# Patient Record
Sex: Female | Born: 1960 | Race: White | Hispanic: No | Marital: Married | Smoking: Never smoker
Health system: Southern US, Community
[De-identification: ages and names within clinical notes are randomized; demographics above are authoritative.]

## PROBLEM LIST (undated history)

## (undated) DIAGNOSIS — C801 Malignant (primary) neoplasm, unspecified: Secondary | ICD-10-CM

## (undated) HISTORY — PX: BREAST SURGERY: SHX581

## (undated) HISTORY — PX: ABDOMINAL HYSTERECTOMY: SHX81

## (undated) HISTORY — PX: TONSILLECTOMY: SUR1361

## (undated) HISTORY — PX: KNEE SURGERY: SHX244

## (undated) HISTORY — PX: APPENDECTOMY: SHX54

## (undated) HISTORY — PX: CHOLECYSTECTOMY: SHX55

---

## 2002-11-12 ENCOUNTER — Ambulatory Visit (HOSPITAL_COMMUNITY): Admission: RE | Admit: 2002-11-12 | Discharge: 2002-11-12 | Payer: Self-pay | Admitting: Orthopedic Surgery

## 2005-12-31 ENCOUNTER — Emergency Department: Payer: Self-pay | Admitting: Emergency Medicine

## 2005-12-31 ENCOUNTER — Other Ambulatory Visit: Payer: Self-pay

## 2014-02-18 ENCOUNTER — Encounter (HOSPITAL_BASED_OUTPATIENT_CLINIC_OR_DEPARTMENT_OTHER): Payer: Self-pay | Admitting: Emergency Medicine

## 2014-02-18 ENCOUNTER — Emergency Department (HOSPITAL_BASED_OUTPATIENT_CLINIC_OR_DEPARTMENT_OTHER)
Admission: EM | Admit: 2014-02-18 | Discharge: 2014-02-18 | Disposition: A | Discharge status: Home / Self Care | Payer: Worker's Compensation | Attending: Emergency Medicine | Admitting: Emergency Medicine

## 2014-02-18 ENCOUNTER — Emergency Department (HOSPITAL_BASED_OUTPATIENT_CLINIC_OR_DEPARTMENT_OTHER): Payer: Worker's Compensation

## 2014-02-18 DIAGNOSIS — S6991XA Unspecified injury of right wrist, hand and finger(s), initial encounter: Secondary | ICD-10-CM

## 2014-02-18 DIAGNOSIS — Y990 Civilian activity done for income or pay: Secondary | ICD-10-CM | POA: Insufficient documentation

## 2014-02-18 DIAGNOSIS — X58XXXA Exposure to other specified factors, initial encounter: Secondary | ICD-10-CM | POA: Insufficient documentation

## 2014-02-18 DIAGNOSIS — Y9289 Other specified places as the place of occurrence of the external cause: Secondary | ICD-10-CM | POA: Diagnosis not present

## 2014-02-18 DIAGNOSIS — T1490XA Injury, unspecified, initial encounter: Secondary | ICD-10-CM

## 2014-02-18 DIAGNOSIS — Y9389 Activity, other specified: Secondary | ICD-10-CM | POA: Diagnosis not present

## 2014-02-18 HISTORY — DX: Malignant (primary) neoplasm, unspecified: C80.1

## 2014-02-18 NOTE — ED Notes (Signed)
Given ace wrap for home use

## 2014-02-18 NOTE — ED Notes (Signed)
Injury to her right hand while at work today.

## 2014-02-18 NOTE — ED Provider Notes (Signed)
Medical screening examination/treatment/procedure(s) were performed by non-physician practitioner and as supervising physician I was immediately available for consultation/collaboration.   EKG Interpretation None        Wandra Arthurs, MD 02/18/14 [phone]

## 2014-02-18 NOTE — Discharge Instructions (Signed)
Elastic Bandage and RICE °Elastic bandages come in different shapes and sizes. They perform different functions. Your caregiver will help you to decide what is best for your protection, recovery, or rehabilitation following an injury. The following are some general tips to help you use an elastic bandage. °· Use the bandage as directed by the maker of the bandage you are using. °· Do not wrap it too tight. This may cut off the circulation of the arm or leg below the bandage. °· If part of your body beyond the bandage becomes blue, numb, or swollen, it is too tight. Loosen the bandage as needed to prevent these problems. °· See your caregiver or trainer if the bandage seems to be making your problems worse rather than better. °Bandages may be a reminder to you that you have an injury. However, they provide very little support. The few pounds of support they provide are minor considering the pressure it takes to injure a joint or tear ligaments. Therefore, the joint will not be able to handle all of the wear and tear it could before the injury. °The routine care of many injuries includes Rest, Ice, Compression, and Elevation (RICE). °· Rest is required to allow your body to heal. Generally, routine activities can be resumed when comfortable. Injured tendons and bones take about 6 weeks to heal. °· Icing the injury helps keep the swelling down and reduces pain. Do not apply ice directly to the skin. Put ice in a plastic bag. Place a towel between the skin and the bag. This will prevent frostbite to the skin. Apply ice bags to the injured area for 15-20 minutes, every 2 hours while awake. Do this for the first 24 to 48 hours, then as directed by your caregiver. °· Compression helps keep swelling down, gives support, and helps with discomfort. If an elastic bandage has been applied today, it should be removed and reapplied every 3 to 4 hours. It should not be applied tightly, but firmly enough to keep swelling down.  Watch fingers or toes for swelling, bluish discoloration, coldness, numbness, or increased pain. If any of these problems occur, remove the bandage and reapply it more loosely. If these problems persist, contact your caregiver. °· Elevation helps reduce swelling and decreases pain. The injured area (arms, hands, legs, or feet) should be placed near to or above the heart (center of the chest) if able. °Persistent pain and inability to use the injured area for more than 2 to 3 days are warning signs. You should see a caregiver for a follow-up visit as soon as possible. Initially, a minor broken bone (hairline fracture) may not be seen on X-rays. It may take 7 to 10 days to finally show up. Continued pain and swelling show that further evaluation and/or X-rays are needed. Make a follow-up visit with your caregiver. A specialist in reading X-rays (radiologist) will read your X-rays again. °Finding out the results of your test °Not all test results are available during your visit. If your test results are not back during the visit, make an appointment with your caregiver to find out the results. Do not assume everything is normal if you have not heard from your caregiver or the medical facility. It is important for you to follow up on all of your test results. °Document Released: 10/06/2001 Document Revised: 07/09/2011 Document Reviewed: 08/18/2007 °[employer]® Patient Information ©2015 [employer]. This information is not intended to replace advice given to you by your health care provider. Make sure   you discuss any questions you have with your health care provider. ° °
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
## 2014-02-26 ENCOUNTER — Emergency Department (HOSPITAL_BASED_OUTPATIENT_CLINIC_OR_DEPARTMENT_OTHER): Payer: Worker's Compensation

## 2014-02-26 ENCOUNTER — Encounter (HOSPITAL_BASED_OUTPATIENT_CLINIC_OR_DEPARTMENT_OTHER): Payer: Self-pay | Admitting: Emergency Medicine

## 2014-02-26 ENCOUNTER — Emergency Department (HOSPITAL_BASED_OUTPATIENT_CLINIC_OR_DEPARTMENT_OTHER)
Admission: EM | Admit: 2014-02-26 | Discharge: 2014-02-26 | Disposition: A | Discharge status: Home / Self Care | Payer: Worker's Compensation | Attending: Emergency Medicine | Admitting: Emergency Medicine

## 2014-02-26 DIAGNOSIS — Z859 Personal history of malignant neoplasm, unspecified: Secondary | ICD-10-CM | POA: Insufficient documentation

## 2014-02-26 DIAGNOSIS — X58XXXA Exposure to other specified factors, initial encounter: Secondary | ICD-10-CM | POA: Diagnosis not present

## 2014-02-26 DIAGNOSIS — S60921A Unspecified superficial injury of right hand, initial encounter: Secondary | ICD-10-CM | POA: Diagnosis present

## 2014-02-26 DIAGNOSIS — T1490XA Injury, unspecified, initial encounter: Secondary | ICD-10-CM

## 2014-02-26 DIAGNOSIS — Y9289 Other specified places as the place of occurrence of the external cause: Secondary | ICD-10-CM | POA: Insufficient documentation

## 2014-02-26 DIAGNOSIS — S62309A Unspecified fracture of unspecified metacarpal bone, initial encounter for closed fracture: Secondary | ICD-10-CM

## 2014-02-26 DIAGNOSIS — S62356A Nondisplaced fracture of shaft of fifth metacarpal bone, right hand, initial encounter for closed fracture: Secondary | ICD-10-CM | POA: Insufficient documentation

## 2014-02-26 DIAGNOSIS — Y9354 Activity, bowling: Secondary | ICD-10-CM | POA: Insufficient documentation

## 2014-02-26 MED ORDER — ONDANSETRON 4 MG PO TBDP
4.0000 mg | ORAL_TABLET | Freq: Once | ORAL | Status: AC
  Administered 2014-02-26: 4 mg via ORAL
  Filled 2014-02-26: qty 1

## 2014-02-26 MED ORDER — IBUPROFEN 800 MG PO TABS
800.0000 mg | ORAL_TABLET | Freq: Once | ORAL | Status: AC
  Administered 2014-02-26: 800 mg via ORAL
  Filled 2014-02-26: qty 1

## 2014-02-26 MED ORDER — HYDROCODONE-ACETAMINOPHEN 5-325 MG PO TABS: 1.0000 | ORAL_TABLET | Freq: Once | ORAL | Status: DC

## 2014-02-26 MED ORDER — IBUPROFEN 800 MG PO TABS: 800.0000 mg | ORAL_TABLET | Freq: Three times a day (TID) | ORAL | Status: AC

## 2014-02-26 NOTE — Discharge Instructions (Signed)
Cryotherapy °Cryotherapy means treatment with cold. Ice or gel packs can be used to reduce both pain and swelling. Ice is the most helpful within the first 24 to 48 hours after an injury or flare-up from overusing a muscle or joint. Sprains, strains, spasms, burning pain, shooting pain, and aches can all be eased with ice. Ice can also be used when recovering from surgery. Ice is effective, has very few side effects, and is safe for most people to use. °PRECAUTIONS  °Ice is not a safe treatment option for people with: °· Raynaud phenomenon. This is a condition affecting small blood vessels in the extremities. Exposure to cold may cause your problems to return. °· Cold hypersensitivity. There are many forms of cold hypersensitivity, including: °¨ Cold urticaria. Red, itchy hives appear on the skin when the tissues begin to warm after being iced. °¨ Cold erythema. This is a red, itchy rash caused by exposure to cold. °¨ Cold hemoglobinuria. Red blood cells break down when the tissues begin to warm after being iced. The hemoglobin that carry oxygen are passed into the urine because they cannot combine with blood proteins fast enough. °· Numbness or altered sensitivity in the area being iced. °If you have any of the following conditions, do not use ice until you have discussed cryotherapy with your caregiver: °· Heart conditions, such as arrhythmia, angina, or chronic heart disease. °· High blood pressure. °· Healing wounds or open skin in the area being iced. °· Current infections. °· Rheumatoid arthritis. °· Poor circulation. °· Diabetes. °Ice slows the blood flow in the region it is applied. This is beneficial when trying to stop inflamed tissues from spreading irritating chemicals to surrounding tissues. However, if you expose your skin to cold temperatures for too long or without the proper protection, you can damage your skin or nerves. Watch for signs of skin damage due to cold. °HOME CARE INSTRUCTIONS °Follow  these tips to use ice and cold packs safely. °· Place a dry or damp towel between the ice and skin. A damp towel will cool the skin more quickly, so you may need to shorten the time that the ice is used. °· For a more rapid response, add gentle compression to the ice. °· Ice for no more than 10 to 20 minutes at a time. The bonier the area you are icing, the less time it will take to get the benefits of ice. °· Check your skin after 5 minutes to make sure there are no signs of a poor response to cold or skin damage. °· Rest 20 minutes or more between uses. °· Once your skin is numb, you can end your treatment. You can test numbness by very lightly touching your skin. The touch should be so light that you do not see the skin dimple from the pressure of your fingertip. When using ice, most people will feel these normal sensations in this order: cold, burning, aching, and numbness. °· Do not use ice on someone who cannot communicate their responses to pain, such as small children or people with dementia. °HOW TO MAKE AN ICE PACK °Ice packs are the most common way to use ice therapy. Other methods include ice massage, ice baths, and cryosprays. Muscle creams that cause a cold, tingly feeling do not offer the same benefits that ice offers and should not be used as a substitute unless recommended by your caregiver. °To make an ice pack, do one of the following: °· Place crushed ice or a   bag of frozen vegetables in a sealable plastic bag. Squeeze out the excess air. Place this bag inside another plastic bag. Slide the bag into a pillowcase or place a damp towel between your skin and the bag.  Mix 3 parts water with 1 part rubbing alcohol. Freeze the mixture in a sealable plastic bag. When you remove the mixture from the freezer, it will be slushy. Squeeze out the excess air. Place this bag inside another plastic bag. Slide the bag into a pillowcase or place a damp towel between your skin and the bag. SEEK MEDICAL CARE  IF:  You develop white spots on your skin. This may give the skin a blotchy (mottled) appearance.  Your skin turns blue or pale.  Your skin becomes waxy or hard.  Your swelling gets worse. MAKE SURE YOU:   Understand these instructions.  Will watch your condition.  Will get help right away if you are not doing well or get worse. Document Released: 12/11/2010 Document Revised: 08/31/2013 Document Reviewed: 12/11/2010 [facility] Patient Information 2015 [place], [place]. This information is not intended to replace advice given to you by your health care provider. Make sure you discuss any questions you have with your health care provider. Hand Fracture, Fifth Metacarpal The small metacarpal is the bone at the base of the little finger between the knuckle and the wrist. A fracture is a break in that bone. One of the fractures that is common to this bone is called a Boxer's Fracture. TREATMENT These fractures can be treated with:   Reduction (bones moved back into place), then pinned through the skin to maintain the position, and then casted for about 6 weeks or as your caregiver determines necessary.  ORIF (open reduction and internal fixation) - the fracture site is opened and the bone pieces are fixed into place with pins and then casted for approximately 6 weeks or as your caregiver determines necessary. Your caregiver will discuss the type of fracture you have and the treatment that should be best for that problem. If surgery is the treatment of choice, the following is information for you to know, and also let your caregiver know about prior to surgery.  LET YOUR CAREGIVER KNOW ABOUT:  Allergies.  Medications taken including herbs, eye drops, over the counter medications, and creams.  Use of steroids (by mouth or creams).  Previous problems with anesthetics or novocaine.  Possibility of pregnancy, if this applies.  History of blood clots (thrombophlebitis).  History of  bleeding or blood problems.  Previous surgery.  Other health problems. AFTER THE PROCEDURE After surgery, you will be taken to the recovery area where a nurse will watch and check your progress. Once you're awake, stable, and taking fluids well, barring other problems you'll be allowed to go home. Once home an ice pack applied to your operative site may help with discomfort and keep the swelling down. HOME CARE INSTRUCTIONS   Follow your caregiver's instructions as to activities, exercises, physical therapy, and driving a car.  Daily exercise is helpful for maintaining range of motion (movement and mobility) and strength. Exercise as instructed.  To lessen swelling, keep the injured hand elevated above the level of your heart as much as possible.  Apply ice to the injury for 15-20 minutes each hour while awake for the first 2 days. Put the ice in a plastic bag and place a thin towel between the bag of ice and your cast.  Move the fingers of your casted hand at least  several times a day.  If a plaster or fiberglass cast was applied:  Do not try to scratch the skin under the cast using a sharp or pointed object.  Check the skin around the cast every day. You may put lotion on red or sore areas.  Keep your cast dry. Your cast can be protected during bathing with a plastic bag. Do not put your cast into the water.  If a plaster splint was applied:  Wear the splint for as long as directed by your caregiver or until seen for follow-up examination.  Do not get your splint wet. Protect it during bathing with a plastic bag.  You may loosen the elastic bandage around the splint if your fingers start to get numb, tingle, get cold or turn blue.  Do not put pressure on your cast or splint; this may cause it to break. Especially, do not lean plaster casts on hard surfaces for 24 hours after application.  Take medications as directed by your caregiver.  Only take over-the-counter or  prescription medicines for pain, discomfort, or fever as directed by your caregiver.  Follow all instructions for physician referrals, physical therapy, and rehabilitation. Any delay in obtaining necessary care could result in permanent injury, disability and chronic pain. SEEK MEDICAL CARE IF:   Increased bleeding (more than a small spot) from the wound or from beneath your cast or splint if there is a wound beneath the cast from surgery.  Redness, swelling, or increasing pain in the wound or from beneath your cast or splint.  Pus coming from wound or from beneath your cast or splint.  An unexplained oral temperature above 102 F (38.9 C) develops.  A foul smell coming from the wound or dressing or from beneath your cast or splint.  You are unable to move your little finger. SEEK IMMEDIATE MEDICAL CARE IF:  You develop a rash, have difficulty breathing, or have any allergy problems. If you do not have a window in your cast for observing the wound, a discharge or minor bleeding may show up as a stain on the outside of your cast. Report these findings to your caregiver. MAKE SURE YOU:   Understand these instructions.  Will watch your condition.  Will get help right away if you are not doing well or get worse. Document Released: 07/23/2000 Document Revised: 07/09/2011 Document Reviewed: 12/04/2007 [facility] Patient Information 2015 [place], [place]. This information is not intended to replace advice given to you by your health care provider. Make sure you discuss any questions you have with your health care provider.

## 2014-02-26 NOTE — ED Provider Notes (Signed)
CSN: [id number]     Arrival date & time 02/26/14  1321 History   First MD Initiated Contact with Patient 02/26/14 1330     Chief Complaint  Patient presents with  . Hand Injury     (Consider location/radiation/quality/duration/timing/severity/associated sxs/prior Treatment) Patient is a 52 y.o. female presenting with hand injury. The history is provided by the patient. No language interpreter was used.  Hand Injury Location:  Hand Injury: yes   Hand location:  R hand Chronicity:  New Handedness:  Right-handed Dislocation: no   Foreign body present:  No foreign bodies Associated symptoms: no fever   Associated symptoms comment:  She reached across the bowling ball return to prevent a child in her care from being hurt and her right fifth finger became caught in the equipment causing severe pain along the 5th finger. No other injury.   Past Medical History  Diagnosis Date  . Cancer    Past Surgical History  Procedure Laterality Date  . Breast surgery    . Cholecystectomy    . Appendectomy    . Tonsillectomy    . Knee surgery    . Abdominal hysterectomy     History reviewed. No pertinent family history. History  Substance Use Topics  . Smoking status: Never Smoker   . Smokeless tobacco: Not on file  . Alcohol Use: No   OB History   Grav Para Term Preterm Abortions TAB SAB Ect Mult Living                 Review of Systems  Constitutional: Negative for fever and chills.  Musculoskeletal:       See HPI.  Skin: Positive for color change. Negative for wound.  Neurological: Negative.  Negative for numbness.      Allergies  Cephalosporins; Fish-derived products; and Sulfa antibiotics  Home Medications   Prior to Admission medications   Not on File   BP 143/82  Pulse 89  Temp(Src) 98.1 F (36.7 C) (Oral)  Resp 20  SpO2 99% Physical Exam  Constitutional: She is oriented to person, place, and time. She appears well-developed and well-nourished.  Neck:  Normal range of motion.  Pulmonary/Chest: Effort normal.  Musculoskeletal:  Right wrist non-tender, FROM. Tender with swelling and ecchymosis along 5th MT and proximal finger. Full flexion and extension at PIP and DIP, with limited movement of MCP. Cap RF < 2s distally.  Neurological: She is alert and oriented to person, place, and time.  Skin: Skin is warm and dry.    ED Course  Procedures (including critical care time) Labs Review Labs Reviewed - No data to display  Imaging Review Dg Hand Complete Right  02/26/2014   CLINICAL DATA:  [place] [address] [place]. Fifth metacarpal pain. Initial encounter.  EXAM: RIGHT HAND - COMPLETE 3+ VIEW  COMPARISON:  02/18/2014  FINDINGS: There is an oblique fracture through the right fifth metacarpal shaft. Minimal displacement. Mild overlying soft tissue swelling. No overlying soft tissue swelling. No additional acute bony abnormality. No intra-articular extension. Joint spaces are maintained.  IMPRESSION: Oblique fracture through the mid portion of the right fifth metacarpal, minimally displaced.   Electronically Signed   By: Rolm Baptise M.D.   On: 02/26/2014 14:20     EKG Interpretation None      MDM   Final diagnoses:  Injury    1. MC fracture, 5th  Minimally displaced metacarpal fracture of midshaft 5th MC. Ulnar gutter splint applied. Hand referral provided to Dr. Caralyn Guile, however, the patient  has an orthopedist in [facility] as well she would prefer.     Dewaine Oats, PA-C 02/26/14 1501

## 2014-02-26 NOTE — ED Notes (Signed)
Pt [place] hand [place], hematoma apparent.

## 2014-02-26 NOTE — ED Provider Notes (Signed)
Medical screening examination/treatment/procedure(s) were performed by non-physician practitioner and as supervising physician I was immediately available for consultation/collaboration.   EKG Interpretation None        Sharyon Cable, MD 02/26/14 1511

## 2014-02-27 ENCOUNTER — Emergency Department (HOSPITAL_BASED_OUTPATIENT_CLINIC_OR_DEPARTMENT_OTHER)
Admission: EM | Admit: 2014-02-27 | Discharge: 2014-02-27 | Disposition: A | Discharge status: Home / Self Care | Payer: Worker's Compensation | Attending: Emergency Medicine | Admitting: Emergency Medicine

## 2014-02-27 ENCOUNTER — Encounter (HOSPITAL_BASED_OUTPATIENT_CLINIC_OR_DEPARTMENT_OTHER): Payer: Self-pay | Admitting: Emergency Medicine

## 2014-02-27 DIAGNOSIS — W01198D Fall on same level from slipping, tripping and stumbling with subsequent striking against other object, subsequent encounter: Secondary | ICD-10-CM | POA: Insufficient documentation

## 2014-02-27 DIAGNOSIS — S62326D Displaced fracture of shaft of fifth metacarpal bone, right hand, subsequent encounter for fracture with routine healing: Secondary | ICD-10-CM | POA: Insufficient documentation

## 2014-02-27 DIAGNOSIS — S6991XD Unspecified injury of right wrist, hand and finger(s), subsequent encounter: Secondary | ICD-10-CM | POA: Diagnosis present

## 2014-02-27 DIAGNOSIS — S6291XD Unspecified fracture of right wrist and hand, subsequent encounter for fracture with routine healing: Secondary | ICD-10-CM

## 2014-02-27 MED ORDER — KETOROLAC TROMETHAMINE 60 MG/2ML IM SOLN
60.0000 mg | Freq: Once | INTRAMUSCULAR | Status: AC
  Administered 2014-02-27: 60 mg via INTRAMUSCULAR
  Filled 2014-02-27: qty 2

## 2014-02-27 MED ORDER — TRAMADOL HCL 50 MG PO TABS: 50.0000 mg | ORAL_TABLET | Freq: Four times a day (QID) | ORAL | Status: AC | PRN

## 2014-02-27 NOTE — ED Provider Notes (Signed)
CSN: [id number]     Arrival date & time 02/27/14  1424 History   First MD Initiated Contact with Patient 02/27/14 1431     Chief Complaint  Patient presents with  . Hand Injury     (Consider location/radiation/quality/duration/timing/severity/associated sxs/prior Treatment) HPI Comments: Patient is a 53 year old female who presents after being diagnosed with a right hand fracture yesterday complaining of severe right hand pain. Symptoms started gradually and progressively worsened since the onset. The pain is throbbing and severe without radiation. Patient reports swelling fingers of the right hand. She reports her splint is too tight. No other symptoms. No alleviating factors.    Past Medical History  Diagnosis Date  . Cancer    Past Surgical History  Procedure Laterality Date  . Breast surgery    . Cholecystectomy    . Appendectomy    . Tonsillectomy    . Knee surgery    . Abdominal hysterectomy     No family history on file. History  Substance Use Topics  . Smoking status: Never Smoker   . Smokeless tobacco: Not on file  . Alcohol Use: No   OB History   Grav Para Term Preterm Abortions TAB SAB Ect Mult Living                 Review of Systems  Musculoskeletal: Positive for arthralgias and joint swelling.  All other systems reviewed and are negative.     Allergies  Butorphanol; Ceftin; Cephalosporins; Ciprofloxacin; Clarithromycin; Doxycycline; Fish-derived products; Meperidine and related; Morphine and related; Sulfa antibiotics; Codeine; and Metronidazole  Home Medications   Prior to Admission medications   Medication Sig Start Date End Date Taking? Authorizing Provider  ibuprofen (ADVIL,MOTRIN) 800 MG tablet Take 1 tablet (800 mg total) by mouth 3 (three) times daily. 02/26/14  Yes Shari A Upstill, PA-C   BP 126/73  Pulse 80  Temp(Src) 97.8 F (36.6 C) (Oral)  Resp 18  SpO2 98% Physical Exam  Nursing note and vitals reviewed. Constitutional: She is  oriented to person, place, and time. She appears well-developed and well-nourished. No distress.  HENT:  Head: Normocephalic and atraumatic.  Eyes: Conjunctivae and EOM are normal.  Neck: Normal range of motion.  Cardiovascular: Normal rate and regular rhythm.  Exam reveals no gallop and no friction rub.   No murmur heard. Pulmonary/Chest: Effort normal and breath sounds normal. She has no wheezes. She has no rales. She exhibits no tenderness.  Abdominal: Soft. There is no tenderness.  Musculoskeletal:  Splint noted on right forearm. Generalized edema of fingers of right hand. Patient is able to move her fingers. Sufficient capillary refill.  Neurological: She is alert and oriented to person, place, and time.  Speech is goal-oriented. Moves limbs without ataxia.   Skin: Skin is warm and dry.  Psychiatric: She has a normal mood and affect. Her behavior is normal.    ED Course  Procedures (including critical care time) Labs Review Labs Reviewed - No data to display  Imaging Review Dg Hand Complete Right  02/26/2014   CLINICAL DATA:  [place] bowling. Fifth metacarpal pain. Initial encounter.  EXAM: RIGHT HAND - COMPLETE 3+ VIEW  COMPARISON:  02/18/2014  FINDINGS: There is an oblique fracture through the right fifth metacarpal shaft. Minimal displacement. Mild overlying soft tissue swelling. No overlying soft tissue swelling. No additional acute bony abnormality. No intra-articular extension. Joint spaces are maintained.  IMPRESSION: Oblique fracture through the mid portion of the right fifth metacarpal, minimally displaced.  Electronically Signed   By: Rolm Baptise M.D.   On: 02/26/2014 82:80   SPLINT APPLICATION Date/Time: 0:34 PM Authorized by: Alvina Chou Consent: Verbal consent obtained. Risks and benefits: risks, benefits and alternatives were discussed Consent given by: patient Splint applied by: orthopedic technician Location details: right hand Splint type: volar  splint Post-procedure: The splinted body part was neurovascularly unchanged following the procedure. Patient tolerance: Patient tolerated the procedure well with no immediate complications.      EKG Interpretation None      MDM   Final diagnoses:  Closed right hand fracture, with routine healing, subsequent encounter    3:00 PM Patient had splint re-applied for comfort. Patient will have IM toradol here. Patient will be discharged with Tramadol. No signs of compartment syndrome or neurovascular compromise.    Alvina Chou, PA-C 02/27/14 2252

## 2014-02-27 NOTE — Discharge Instructions (Signed)
Take tramadol as needed for pain. Rest, ice, and elevate your hand for pain and swelling relief.

## 2014-02-27 NOTE — ED Notes (Signed)
States has fx in right hand at work.Seen here yesterday for same. Has cast to right hand and arm to below elbow Fingers are swollen but nl cap refill.

## 2015-09-09 IMAGING — CR DG HAND COMPLETE 3+V*R*
3 series · 3 of 3 positions shown · non-contrast
Comparison: 02/18/2014

CLINICAL DATA: Fell bowling. Fifth metacarpal pain. Initial
encounter.

EXAM:
RIGHT HAND - COMPLETE 3+ VIEW

[x hand pa right]
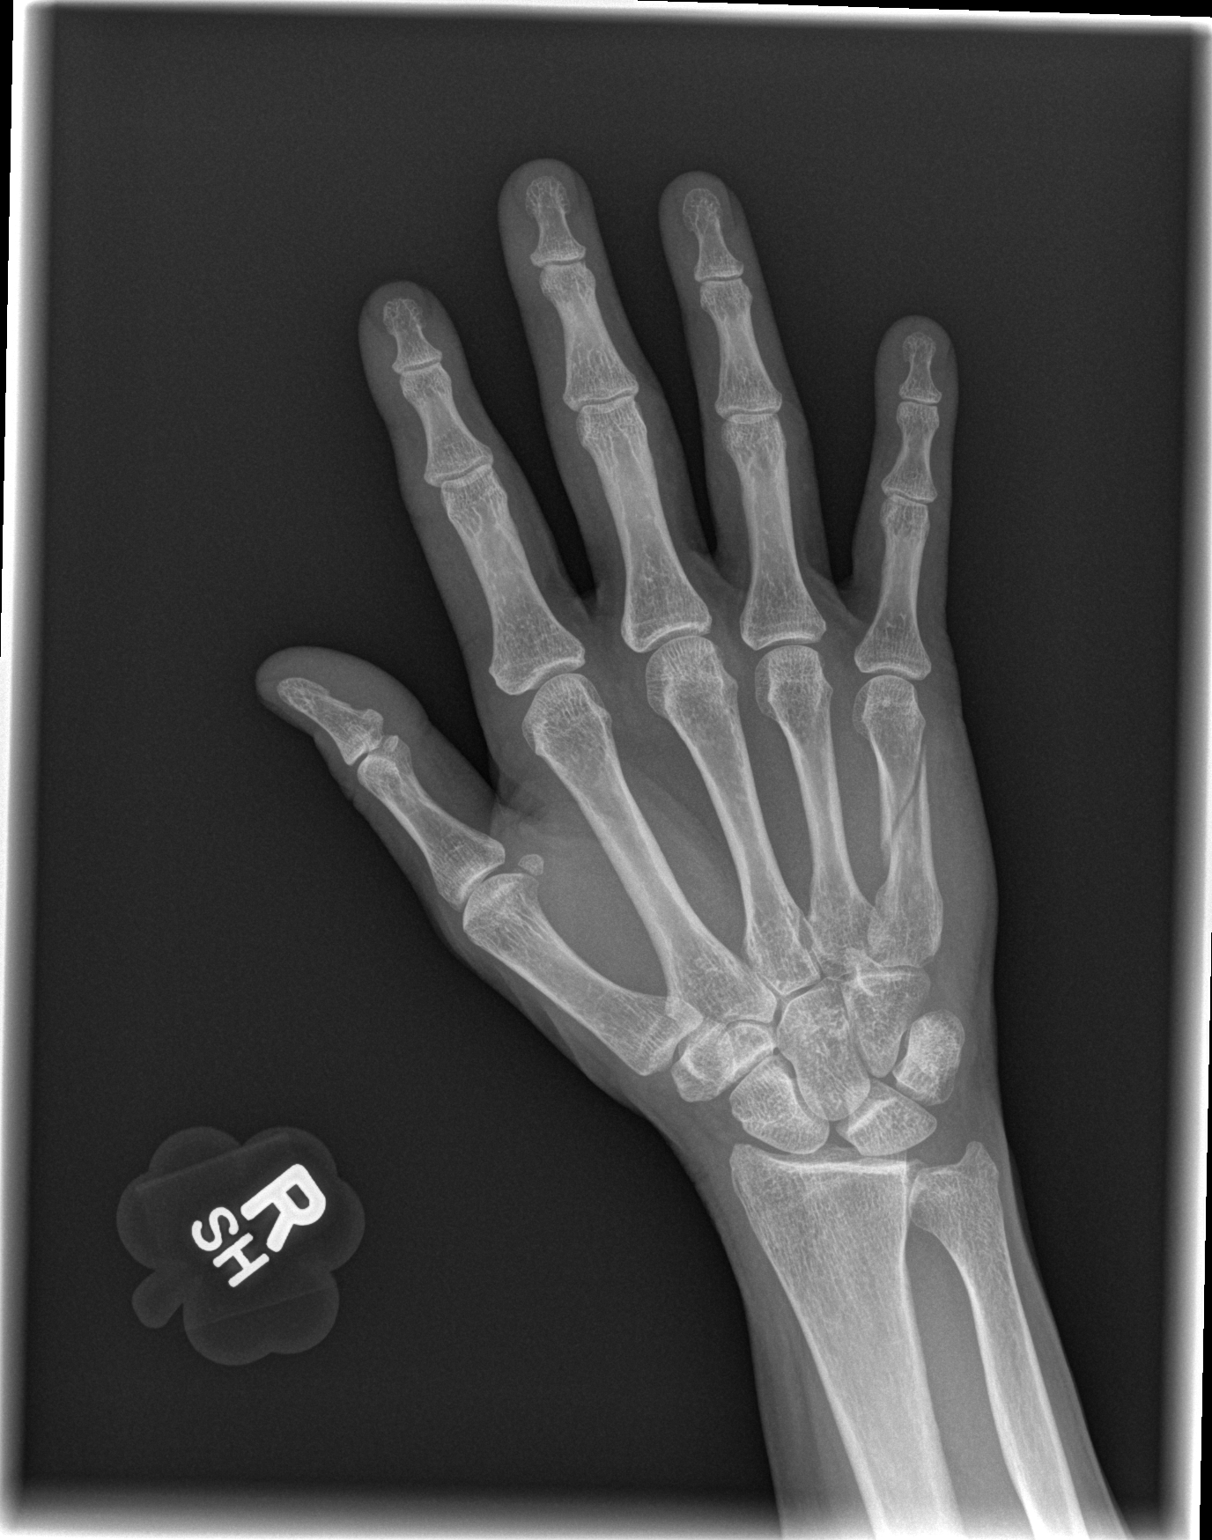

[x hand oblique right]
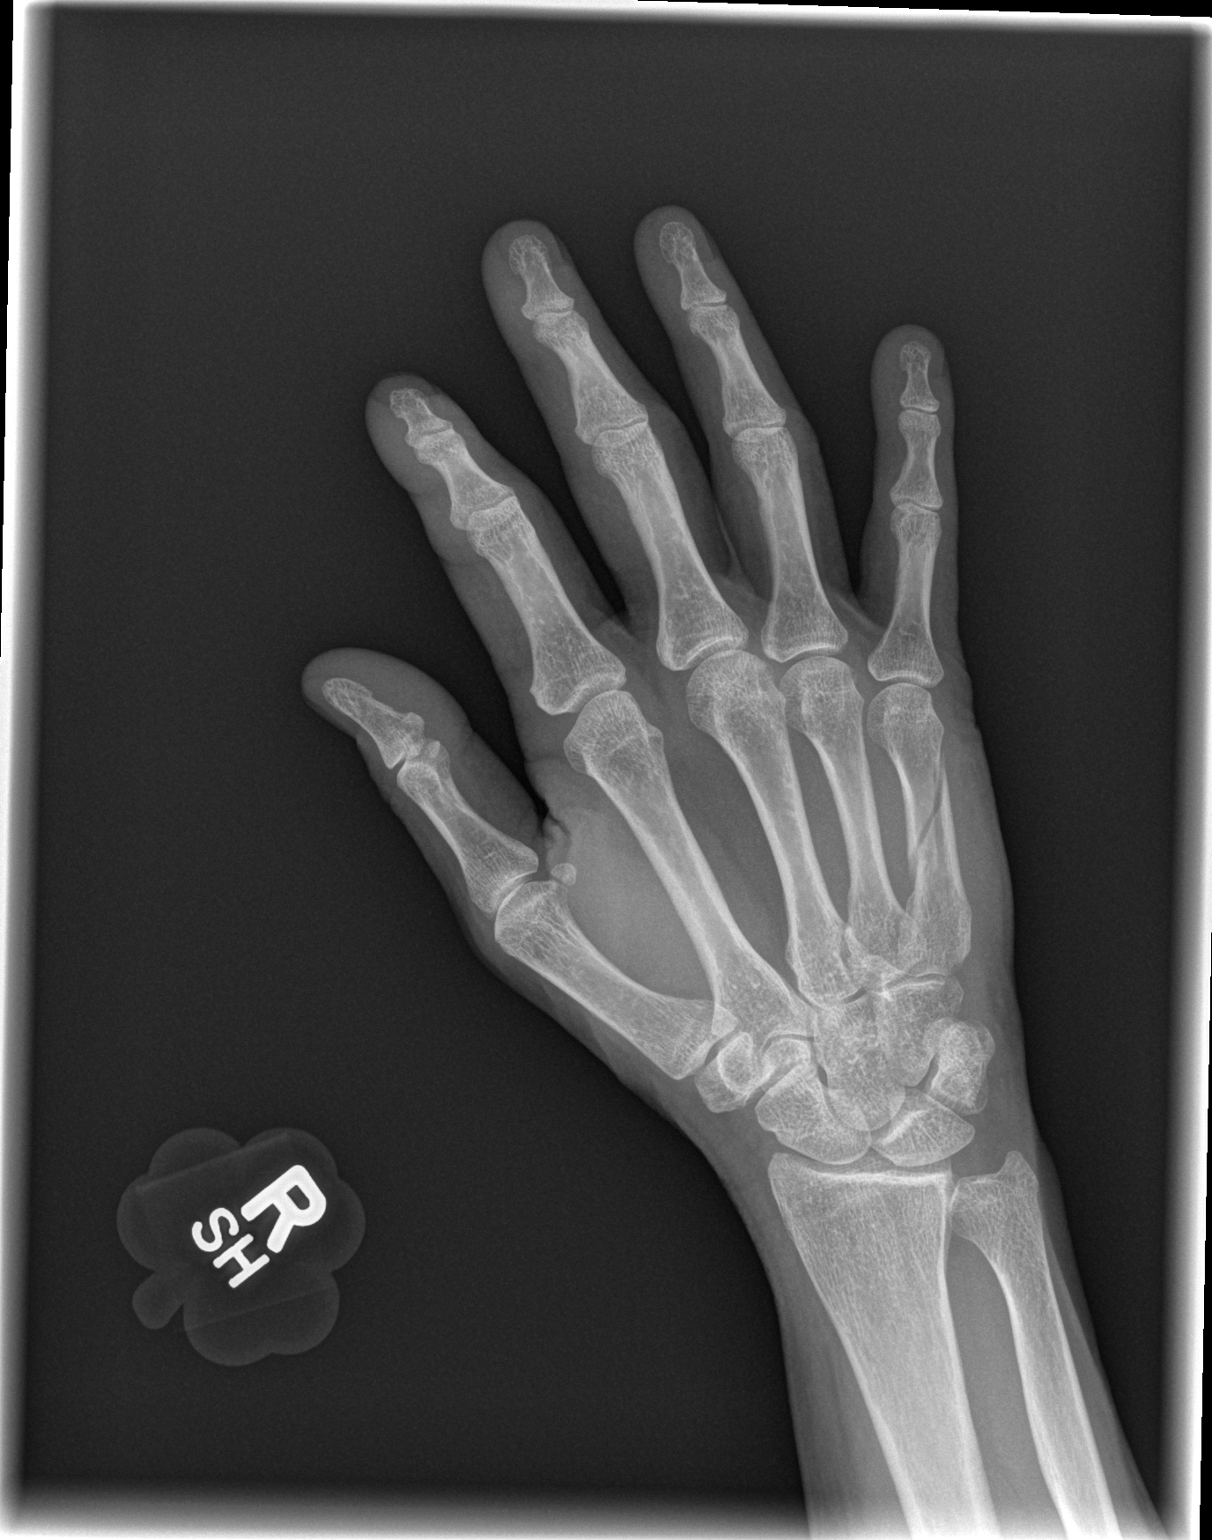

[x hand lat right]
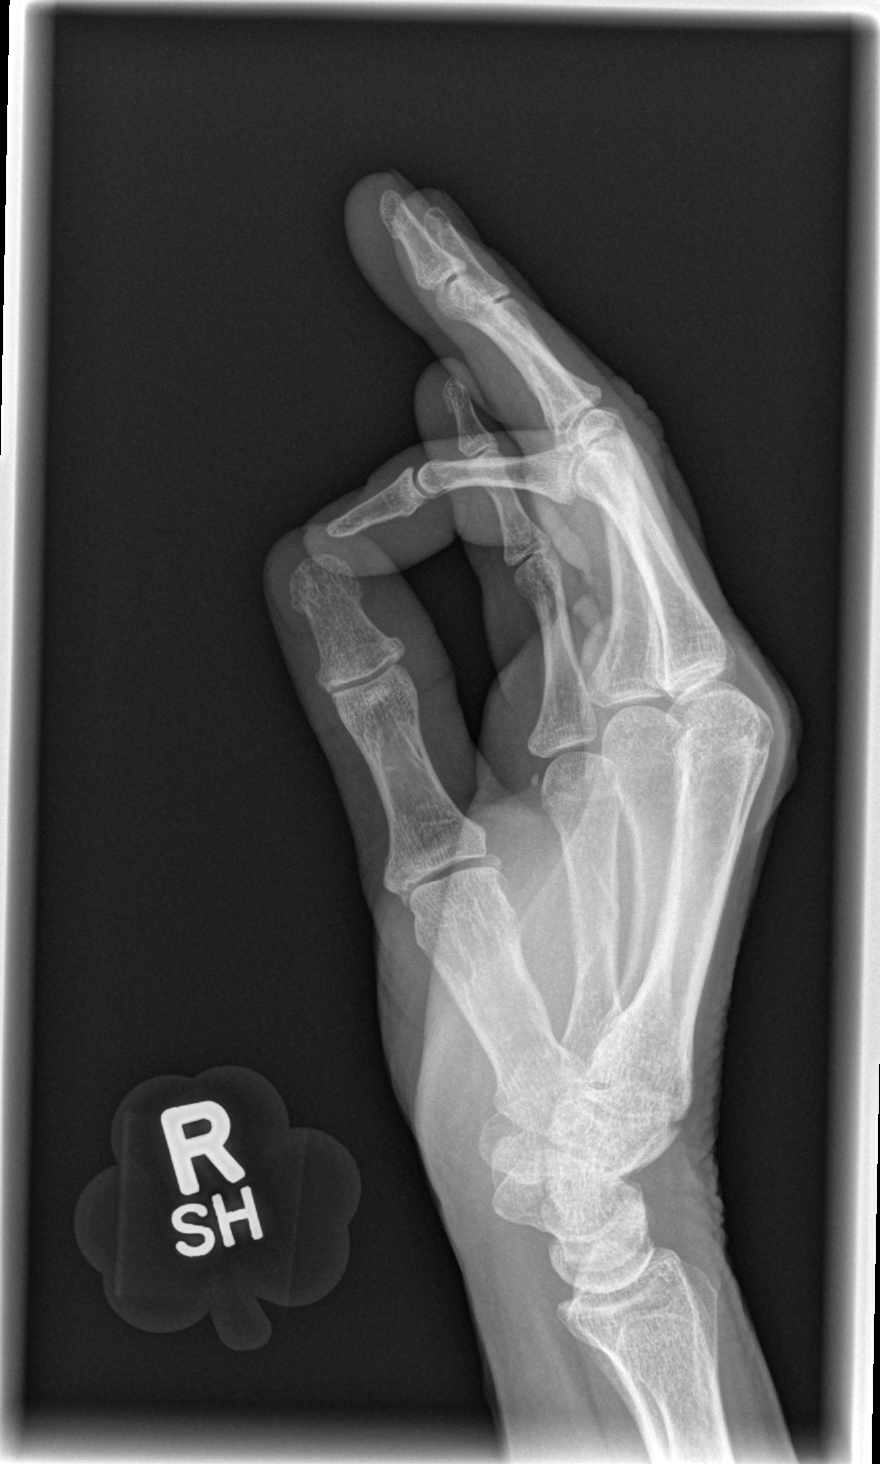

[3 of 3 positions shown; findings below may reference images not displayed]

FINDINGS: There is an oblique fracture through the right fifth metacarpal
shaft. Minimal displacement. Mild overlying soft tissue swelling. No
overlying soft tissue swelling. No additional acute bony
abnormality. No intra-articular extension. Joint spaces are
maintained.
IMPRESSION: Oblique fracture through the mid portion of the right fifth
metacarpal, minimally displaced.

## 2016-02-21 ENCOUNTER — Encounter (HOSPITAL_BASED_OUTPATIENT_CLINIC_OR_DEPARTMENT_OTHER): Payer: Self-pay | Admitting: *Deleted

## 2016-02-21 ENCOUNTER — Emergency Department (HOSPITAL_BASED_OUTPATIENT_CLINIC_OR_DEPARTMENT_OTHER): Payer: Worker's Compensation

## 2016-02-21 ENCOUNTER — Emergency Department (HOSPITAL_BASED_OUTPATIENT_CLINIC_OR_DEPARTMENT_OTHER)
Admission: EM | Admit: 2016-02-21 | Discharge: 2016-02-21 | Disposition: A | Discharge status: Home / Self Care | Payer: Worker's Compensation | Attending: Emergency Medicine | Admitting: Emergency Medicine

## 2016-02-21 DIAGNOSIS — Y929 Unspecified place or not applicable: Secondary | ICD-10-CM | POA: Diagnosis not present

## 2016-02-21 DIAGNOSIS — Y990 Civilian activity done for income or pay: Secondary | ICD-10-CM | POA: Diagnosis not present

## 2016-02-21 DIAGNOSIS — W230XXA Caught, crushed, jammed, or pinched between moving objects, initial encounter: Secondary | ICD-10-CM | POA: Diagnosis not present

## 2016-02-21 DIAGNOSIS — Y9389 Activity, other specified: Secondary | ICD-10-CM | POA: Insufficient documentation

## 2016-02-21 DIAGNOSIS — Z859 Personal history of malignant neoplasm, unspecified: Secondary | ICD-10-CM | POA: Diagnosis not present

## 2016-02-21 DIAGNOSIS — Z79899 Other long term (current) drug therapy: Secondary | ICD-10-CM | POA: Insufficient documentation

## 2016-02-21 DIAGNOSIS — S60222A Contusion of left hand, initial encounter: Secondary | ICD-10-CM | POA: Diagnosis not present

## 2016-02-21 DIAGNOSIS — S6992XA Unspecified injury of left wrist, hand and finger(s), initial encounter: Secondary | ICD-10-CM | POA: Diagnosis present

## 2016-02-21 DIAGNOSIS — S62667A Nondisplaced fracture of distal phalanx of left little finger, initial encounter for closed fracture: Secondary | ICD-10-CM | POA: Insufficient documentation

## 2016-02-21 MED ORDER — ACETAMINOPHEN 325 MG PO TABS
650.0000 mg | ORAL_TABLET | Freq: Once | ORAL | Status: AC
  Administered 2016-02-21: 650 mg via ORAL
  Filled 2016-02-21: qty 2

## 2016-02-21 NOTE — ED Provider Notes (Signed)
[facility] Provider Note   CSN: [id number] Arrival date & time: 02/21/16  1333     History   Chief Complaint Chief Complaint  Patient presents with  . Hand Injury    HPI Diane Carney is a 55 y.o. female with a PMHx of remote breast CA, who presents to the ED with complaints of left hand injury sustained at 12:15 PM while at work. 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 Patient describes the pain is 8/10 constant throbbing pain in the MCP joints of the left hand, radiating up the forearm, worse with palpation, and improved with ice. No other treatments tried prior to arrival. Associated symptoms include swelling and bruising. She denies any loss of range of motion of the fingers/wrist, finger pain, weakness, numbness, tingling, abrasions, or any other injury sustained.   The history is provided by the patient and medical records. No language interpreter was used.  Hand Injury   The incident occurred 3 to 5 hours ago. The incident occurred at work. The injury mechanism was compression. The pain is present in the left hand. The quality of the pain is described as throbbing. The pain is at a severity of 8/10. The pain is moderate. The pain has been constant since the incident. The symptoms are aggravated by palpation. She has tried ice for the symptoms. The treatment provided mild relief.    Past Medical History:  Diagnosis Date  . Cancer ([place])     There are no active problems to display for this patient.   Past Surgical History:  Procedure Laterality Date  . ABDOMINAL HYSTERECTOMY    . APPENDECTOMY    . BREAST SURGERY    . CHOLECYSTECTOMY    . KNEE SURGERY    . TONSILLECTOMY      OB History    No data available       Home Medications    Prior to Admission medications   Medication Sig Start Date  End Date Taking? Authorizing Provider  ibuprofen (ADVIL,MOTRIN) 800 MG tablet Take 1 tablet (800 mg total) by mouth 3 (three) times daily. 02/26/14   Charlann Lange, PA-C  traMADol (ULTRAM) 50 MG tablet Take 1 tablet (50 mg total) by mouth every 6 (six) hours as needed. 02/27/14   Alvina Chou, PA-C    Family History No family history on file.  Social History Social History  Substance Use Topics  . Smoking status: Never Smoker  . Smokeless tobacco: Never Used  . Alcohol use No     Allergies   Butorphanol; Ceftin [cefuroxime axetil]; Cephalosporins; Ciprofloxacin; Clarithromycin; Doxycycline; Fish-derived products; Meperidine and related; Morphine and related; Sulfa antibiotics; Codeine; and Metronidazole   Review of Systems Review of Systems  Musculoskeletal: Positive for arthralgias (L hand) and joint swelling.  Skin: Positive for color change (bruising L hand). Negative for wound.  Allergic/Immunologic: Negative for immunocompromised state.  Neurological: Negative for weakness and numbness.    Physical Exam Updated Vital Signs BP 146/64 (BP Location: Left Arm)   Pulse 70   Temp 98.3 F (36.8 C) (Oral)   Resp 18   Ht 5' (1.524 m)   Wt 51.7 kg   SpO2 100%   BMI 22.26 kg/m   Physical Exam  Constitutional: She is oriented to person, place, and time. Vital signs are normal. She appears well-developed and well-nourished.  Non-toxic appearance. No distress.  Afebrile, nontoxic, NAD  HENT:  Head: Normocephalic and atraumatic.  Mouth/Throat: Mucous membranes are normal.  Eyes: Conjunctivae and EOM are normal. Right eye exhibits no discharge. Left eye exhibits no discharge.  Neck: Normal range of motion. Neck supple.  Cardiovascular: Normal rate and intact distal pulses.   Pulmonary/Chest: Effort normal. No respiratory distress.  Abdominal: Normal appearance. She exhibits no distension.  Musculoskeletal: Normal range of motion.       Left hand: She exhibits  tenderness, bony tenderness and swelling. She exhibits normal range of motion, normal capillary refill, no deformity and no laceration. Normal sensation noted. Normal strength noted.       Hands: L hand and wrist with FROM intact at wrist and all digits, with mild TTP near 4-5th metacarpals, and mild swelling and bruising to L hand near 4-5th MCP joints, no other bony TTP to the remainder of the hand/wrist/fingers, skin intact without abrasions/lacerations, no deformity or crepitus, with strength and sensation grossly intact, distal pulses intact, and soft compartments.   Neurological: She is alert and oriented to person, place, and time. She has normal strength. No sensory deficit.  Skin: Skin is warm, dry and intact. Bruising noted. No rash noted.  L hand Bruising as mentioned above  Psychiatric: She has a normal mood and affect. Her behavior is normal.  Nursing note and vitals reviewed.    ED Treatments / Results  Labs (all labs ordered are listed, but only abnormal results are displayed) Labs Reviewed - No data to display  EKG  EKG Interpretation None       Radiology Dg Hand Complete Left  Result Date: 02/21/2016 CLINICAL DATA:  Left hand pain after crush injury. Pain and bruising of the anterior fifth metacarpal. EXAM: LEFT HAND - COMPLETE 3+ VIEW COMPARISON:  None. FINDINGS: There is no evidence of acute displaced appearing fracture or dislocation. Subtle irregularity of the tuft of the left fifth distal phalanx cannot entirely exclude a nondisplaced tuft fracture. Correlate clinically for pain in this region. There is no evidence of arthropathy or other focal bone abnormality. Soft tissues are unremarkable. IMPRESSION: Subtle cortical irregularity of the left fifth distal phalangeal tuft. Nondisplaced fracture is not entirely excluded. Correlate clinically. Electronically Signed   By: Ashley Royalty M.D.   On: 02/21/2016 14:09    Procedures Procedures (including critical care  time)  Medications Ordered in ED Medications  acetaminophen (TYLENOL) tablet 650 mg (650 mg Oral Given 02/21/16 1512)     Initial Impression / Assessment and Plan / ED Course  I have reviewed the triage vital signs and the nursing notes.  Pertinent labs & imaging results that were available during my care of the patient were reviewed by me and considered in my medical decision making (see chart for details).  Clinical Course    55 y.o. female here with L hand pain after hand was crushed between door and concrete wall. Bruising and swelling to 4-5th MCP joints, mild TTP to this area, no tenderness to wrist or digits. FROM intact in all joints of hand/wrist. NVI with soft compartments. No abrasions/skin injury. Xray showing possible cortical lucency at distal L 5th phalanx, but this does not correlate with her pain; discussed that could be small fx but doubtful given lack of clinical correlation; if pain worsens in this area, can buddy tape it. Will ace wrap for comfort/protection, discussed RICE/tylenol/motrin, and f/up with PCP in 1wk for recheck. I explained the diagnosis and have given explicit  precautions to return to the ER including for any other new or worsening symptoms. The patient understands and accepts the medical plan as it's been dictated and I have answered their questions. Discharge instructions concerning home care and prescriptions have been given. The patient is STABLE and is discharged to home in good condition.   Final Clinical Impressions(s) / ED Diagnoses   Final diagnoses:  Contusion of left hand, initial encounter  Closed nondisplaced fracture of distal phalanx of left little finger, initial encounter    New Prescriptions New Prescriptions   No medications on file     [place] Camprubi-Soms, PA-C 02/21/16 [place], MD 02/21/16 2239

## 2016-02-21 NOTE — Discharge Instructions (Signed)
Wear ace wrap for protection and comfort. May consider buddy taping your pinky for comfort, since you may have a small fracture in the pinky tip. Ice and elevate hand  throughout the day, using ice pack for no more than 20 minutes every hour.  Alternate between tylenol and motrin as needed for pain. Follow up with your regular doctor in 1 week for recheck of symptoms. Return to the ER for changes or worsening symptoms.

## 2016-02-21 NOTE — ED Triage Notes (Signed)
Left hand was crushed between a door and cinder block. No drug screen required.

## 2024-01-25 ENCOUNTER — Other Ambulatory Visit: Payer: Self-pay

## 2024-01-25 ENCOUNTER — Encounter (HOSPITAL_BASED_OUTPATIENT_CLINIC_OR_DEPARTMENT_OTHER): Payer: Self-pay | Admitting: Emergency Medicine

## 2024-01-25 ENCOUNTER — Emergency Department (HOSPITAL_BASED_OUTPATIENT_CLINIC_OR_DEPARTMENT_OTHER)
Admission: EM | Admit: 2024-01-25 | Discharge: 2024-01-25 | Disposition: A | Discharge status: Home / Self Care | Attending: Emergency Medicine | Admitting: Emergency Medicine

## 2024-01-25 ENCOUNTER — Emergency Department (HOSPITAL_BASED_OUTPATIENT_CLINIC_OR_DEPARTMENT_OTHER)

## 2024-01-25 DIAGNOSIS — R600 Localized edema: Secondary | ICD-10-CM | POA: Insufficient documentation

## 2024-01-25 DIAGNOSIS — M79604 Pain in right leg: Secondary | ICD-10-CM | POA: Diagnosis present

## 2024-01-25 DIAGNOSIS — R030 Elevated blood-pressure reading, without diagnosis of hypertension: Secondary | ICD-10-CM

## 2024-01-25 NOTE — ED Triage Notes (Signed)
 Referred by [facility] for further work up of right lower leg pain after yellow jacket sting on Wed. Also having diarrhea since she has been taking Augmentin

## 2024-01-25 NOTE — ED Notes (Signed)
 ED Provider at bedside.

## 2024-01-25 NOTE — Discharge Instructions (Addendum)
 I would recommend taking Benadryl in the evening for swelling and discomfort of right lower leg. You can also use compression stockings and elevate. Please follow-up with family doctor.  Return to ER for new or worsening symptoms.   I would recommend taking blood pressure at home once daily and recording the number.  Take this to primary care when you follow-up in 7 to 10 days.

## 2024-01-25 NOTE — ED Provider Notes (Signed)
 [facility] Provider Note   CSN: [id number] Arrival date & time: 01/25/24  1004     Patient presents with: Leg Pain   Diane Carney is a 63 y.o. female.  Patient with noncontributory past medical history is reporting to emergency room with complaint of right lower extremity swelling and pain.  Patient reports that this started immediately after being stung by a bee on 01/23/2024.  She was stung on the right leg and popliteal region.  She had noted some focal swelling and redness that seem to have spread.  When she was evaluated by her primary care doctor they suspected she was developing a secondary cellulitis.  She was started on Augmentin.  The Augmentin gave her stomach upset loose stool and nausea.  She went to urgent care for this who then became suspicious she needed workup to rule out DVT.  She denies any fevers, chills.  She is not having any redness or warmth of her right lower leg.  She is not having chest pain shortness of breath or cough.  She has no history of DVT or PE.  She does not have any recent travel or recent surgeries.  She is not on a blood thinner.    Leg Pain      Prior to Admission medications   Medication Sig Start Date End Date Taking? Authorizing Provider  ibuprofen  (ADVIL ,MOTRIN ) 800 MG tablet Take 1 tablet (800 mg total) by mouth 3 (three) times daily. 02/26/14   Odell Balls, PA-C  traMADol  (ULTRAM ) 50 MG tablet Take 1 tablet (50 mg total) by mouth every 6 (six) hours as needed. 02/27/14   Szekalski, Kaitlyn, PA-C    Allergies: Butorphanol, Ceftin [cefuroxime axetil], Cephalosporins, Ciprofloxacin, Clarithromycin, Doxycycline, Fish-derived products, Meperidine and related, Morphine and codeine, Sulfa antibiotics, Codeine, and Metronidazole    Review of Systems  Cardiovascular:  Positive for leg swelling.    Updated Vital Signs BP (!) 167/77 (BP Location: Right Arm)   Pulse 89   Temp 97.8 F (36.6 C)  (Oral)   Resp 18   SpO2 97%   Physical Exam Vitals and nursing note reviewed.  Constitutional:      General: She is not in acute distress.    Appearance: She is not toxic-appearing.  HENT:     Head: Normocephalic and atraumatic.  Eyes:     General: No scleral icterus.    Conjunctiva/sclera: Conjunctivae normal.  Cardiovascular:     Rate and Rhythm: Normal rate and regular rhythm.     Pulses: Normal pulses.     Heart sounds: Normal heart sounds.  Pulmonary:     Effort: Pulmonary effort is normal. No respiratory distress.     Breath sounds: Normal breath sounds.  Abdominal:     General: Abdomen is flat. Bowel sounds are normal.     Palpations: Abdomen is soft.     Tenderness: There is no abdominal tenderness.  Musculoskeletal:     Right lower leg: Edema present.     Left lower leg: No edema.     Comments: Sensation of lower extremity intact.  Strong dorsal pedal pulse.  No erythema or warmth to right lower extremity.  No calf tenderness.  Skin:    General: Skin is warm and dry.     Findings: No lesion.  Neurological:     General: No focal deficit present.     Mental Status: She is alert and oriented to person, place, and time. Mental status is at  baseline.     (all labs ordered are listed, but only abnormal results are displayed) Labs Reviewed - No data to display  EKG: None  Radiology: US  Venous Img Lower Unilateral Right Result Date: 01/25/2024 CLINICAL DATA:  Right lower extremity pain and edema. EXAM: RIGHT LOWER EXTREMITY VENOUS DOPPLER ULTRASOUND TECHNIQUE: Gray-scale sonography with graded compression, as well as color Doppler and duplex ultrasound were performed to evaluate the lower extremity deep venous systems from the level of the common femoral vein and including the common femoral, femoral, profunda femoral, popliteal and calf veins including the posterior tibial, peroneal and gastrocnemius veins when visible. The superficial great saphenous vein was also  interrogated. Spectral Doppler was utilized to evaluate flow at rest and with distal augmentation maneuvers in the common femoral, femoral and popliteal veins. COMPARISON:  None Available. FINDINGS: Contralateral Common Femoral Vein: Respiratory phasicity is normal and symmetric with the symptomatic side. No evidence of thrombus. Normal compressibility. Common Femoral Vein: No evidence of thrombus. Normal compressibility, respiratory phasicity and response to augmentation. Saphenofemoral Junction: No evidence of thrombus. Normal compressibility and flow on color Doppler imaging. Profunda Femoral Vein: No evidence of thrombus. Normal compressibility and flow on color Doppler imaging. Femoral Vein: No evidence of thrombus. Normal compressibility, respiratory phasicity and response to augmentation. Popliteal Vein: No evidence of thrombus. Normal compressibility, respiratory phasicity and response to augmentation. Calf Veins: No evidence of thrombus. Normal compressibility and flow on color Doppler imaging. Superficial Great Saphenous Vein: No evidence of thrombus. Normal compressibility. Venous Reflux:  None. Other Findings: No evidence of superficial thrombophlebitis or abnormal fluid collection. IMPRESSION: No evidence of right lower extremity deep venous thrombosis. Electronically Signed   By: Marcey Moan M.D.   On: 01/25/2024 11:37     Procedures   Medications Ordered in the ED - No data to display                                  Medical Decision Making  This patient presents to the ED for concern of right leg pain, this involves an extensive number of treatment options, and is a complaint that carries with it a high risk of complications and morbidity.  The differential diagnosis includes swelling, mild allergic reaction, DVT, acute limb ischemia, cellulitis, CHF   Imaging Studies ordered:  I ordered imaging studies including DVT US  right leg  I independently visualized and interpreted  imaging which showed negative.  I agree with the radiologist interpretation   Cardiac Monitoring: / EKG:  The patient was maintained on a cardiac monitor.     Problem List / ED Course / Critical interventions / Medication management  Patient presents emergency room hemodynamically stable and well-appearing.  She was sent by urgent care to rule out right lower extremity DVT. She is not having CP, SOB or cough. On my exam here she has no obvious cellulitis to her right lower leg.  She has strong dorsal pedal pulse.  She does have mild diffuse swelling of right leg, however she notes this started directly after getting stung by bee.  Given that her DVT study is negative I am suspicious that her swelling is secondary to allergic reaction from this bee sting.  I would recommend that she takes Benadryl, uses compression stockings and elevate extremity.  Since I am not seeing any signs of cellulitis I think it would be reasonable to discontinue the antibiotic given her side effects (  upset stomach, NV ).  Feels she is stable for discharge with outpatient follow-up.  Given return precautions.        Final diagnoses:  Right leg pain    ED Discharge Orders     None          Shermon Warren SAILOR, PA-C 01/25/24 1141    Dreama Longs, MD 01/27/24 1031
